# Patient Record
Sex: Male | Born: 1990
Health system: Southern US, Community
[De-identification: ages and names within clinical notes are randomized; demographics above are authoritative.]

## PROBLEM LIST (undated history)

## (undated) DIAGNOSIS — T7840XA Allergy, unspecified, initial encounter: Secondary | ICD-10-CM

## (undated) DIAGNOSIS — Z789 Other specified health status: Secondary | ICD-10-CM

## (undated) HISTORY — DX: Allergy, unspecified, initial encounter: T78.40XA

## (undated) HISTORY — PX: NO PAST SURGERIES: SHX2092

## (undated) HISTORY — DX: Other specified health status: Z78.9

---

## 2018-09-06 DIAGNOSIS — Z23 Encounter for immunization: Secondary | ICD-10-CM | POA: Diagnosis not present

## 2019-03-12 DIAGNOSIS — Z713 Dietary counseling and surveillance: Secondary | ICD-10-CM | POA: Diagnosis not present

## 2019-04-02 ENCOUNTER — Emergency Department (HOSPITAL_BASED_OUTPATIENT_CLINIC_OR_DEPARTMENT_OTHER): Payer: BLUE CROSS/BLUE SHIELD

## 2019-04-02 ENCOUNTER — Encounter (HOSPITAL_BASED_OUTPATIENT_CLINIC_OR_DEPARTMENT_OTHER): Payer: Self-pay | Admitting: *Deleted

## 2019-04-02 ENCOUNTER — Emergency Department (HOSPITAL_BASED_OUTPATIENT_CLINIC_OR_DEPARTMENT_OTHER)
Admission: EM | Admit: 2019-04-02 | Discharge: 2019-04-02 | Disposition: A | Discharge status: Home / Self Care | Payer: BLUE CROSS/BLUE SHIELD | Attending: Emergency Medicine | Admitting: Emergency Medicine

## 2019-04-02 ENCOUNTER — Other Ambulatory Visit: Payer: Self-pay

## 2019-04-02 DIAGNOSIS — N50819 Testicular pain, unspecified: Secondary | ICD-10-CM | POA: Diagnosis not present

## 2019-04-02 DIAGNOSIS — I861 Scrotal varices: Secondary | ICD-10-CM | POA: Diagnosis not present

## 2019-04-02 DIAGNOSIS — N50812 Left testicular pain: Secondary | ICD-10-CM | POA: Diagnosis not present

## 2019-04-02 DIAGNOSIS — N5082 Scrotal pain: Secondary | ICD-10-CM | POA: Diagnosis not present

## 2019-04-02 LAB — URINALYSIS, ROUTINE W REFLEX MICROSCOPIC
Bilirubin Urine: NEGATIVE
Glucose, UA: NEGATIVE mg/dL
Hgb urine dipstick: NEGATIVE
Ketones, ur: NEGATIVE mg/dL
Leukocytes,Ua: NEGATIVE
Nitrite: NEGATIVE
Protein, ur: NEGATIVE mg/dL
Specific Gravity, Urine: 1.02 (ref 1.005–1.030)
pH: 7 (ref 5.0–8.0)

## 2019-04-02 NOTE — ED Triage Notes (Signed)
Pain in his testicles since Monday. Pain has gone from his right to now in his left testicle.

## 2019-04-02 NOTE — ED Provider Notes (Signed)
[facility] EMERGENCY DEPARTMENT Provider Note   CSN: [id number] Arrival date & time: 04/02/19  1701  History   Chief Complaint Chief Complaint  Patient presents with  . Testicle Pain    HPI Noah Fitzgerald is a 28 y.o. male with no significant past medical history presenting with constant mild left sided testicular pain onset 1 day ago. Patient describes pain as a mild ache. Patient reports pain is worse with movement and better with rest. Patient states he has taken ibuprofen with partial relief. Patient reports he is not sexually active and states he has never been. Patient denies a history of STIs. Patient denies testicular edema. Patient denies penile pain, edema, or discharge. Patient denies fever, dysuria, hematuria, abdominal pain, nausea, vomiting, or diarrhea. Patient denies genital sores. Patient states he has not had these symptoms in the past.      HPI  History reviewed. No pertinent past medical history.  There are no active problems to display for this patient.   History reviewed. No pertinent surgical history.      Home Medications    Prior to Admission medications   Not on File    Family History No family history on file.  Social History Social History   Tobacco Use  . Smoking status: Never Smoker  . Smokeless tobacco: Never Used  Substance Use Topics  . Alcohol use: Yes    Frequency: Never  . Drug use: Never     Allergies   Patient has no known allergies.   Review of Systems Review of Systems  Constitutional: Negative for chills, diaphoresis and fever.  Respiratory: Negative for cough and shortness of breath.   Gastrointestinal: Negative for abdominal pain, diarrhea, nausea and vomiting.  Endocrine: Negative for cold intolerance and heat intolerance.  Genitourinary: Positive for testicular pain. Negative for discharge, dysuria, flank pain, frequency, genital sores, hematuria, penile pain, penile swelling and scrotal swelling.   Musculoskeletal: Negative for back pain.  Skin: Negative for rash and wound.  Allergic/Immunologic: Negative for immunocompromised state.  Hematological: Negative for adenopathy.    Physical Exam Updated Vital Signs BP (!) 148/84   Pulse 98   Temp 98.6 F (37 C) (Oral)   Resp 20   Ht  (1.88 m)   Wt 49.4 kg   SpO2 100%   BMI 13.99 kg/m   Physical Exam Vitals signs and nursing note reviewed. Exam conducted with a chaperone present.  Constitutional:      General: He is not in acute distress.    Appearance: He is well-developed. He is not diaphoretic.  HENT:     Head: Normocephalic and atraumatic.  Neck:     Musculoskeletal: Normal range of motion.  Cardiovascular:     Rate and Rhythm: Normal rate and regular rhythm.     Heart sounds: Normal heart sounds. No murmur. No friction rub. No gallop.   Pulmonary:     Effort: Pulmonary effort is normal. No respiratory distress.     Breath sounds: Normal breath sounds. No wheezing or rales.  Abdominal:     Palpations: Abdomen is soft.     Tenderness: There is no abdominal tenderness.     Hernia: There is no hernia in the right inguinal area or left inguinal area.  Genitourinary:    Pubic Area: No rash.      Penis: Circumcised. Lesions (Penile lesion noted. 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 No phimosis, paraphimosis, hypospadias, erythema, tenderness, discharge or swelling.  Scrotum/Testes: Cremasteric reflex is present.        Right: Mass, tenderness, swelling, testicular hydrocele or varicocele not present. Right testis is descended. Cremasteric reflex is present.         Left: Varicocele present. Mass, tenderness, swelling or testicular hydrocele not present. Left testis is descended. Cremasteric reflex is present.      Epididymis:     Right: Normal. Not inflamed or enlarged. No mass or tenderness.     Left: Normal. Not inflamed or enlarged. No mass or tenderness.  Musculoskeletal: Normal range  of motion.  Lymphadenopathy:     Lower Body: No right inguinal adenopathy. No left inguinal adenopathy.  Skin:    General: Skin is warm.     Findings: No erythema or rash.  Neurological:     Mental Status: He is alert.    ED Treatments / Results  Labs (all labs ordered are listed, but only abnormal results are displayed) Labs Reviewed  URINALYSIS, ROUTINE W REFLEX MICROSCOPIC - Abnormal; Notable for the following components:      Result Value   APPearance CLOUDY (*)    All other components within normal limits  GC/CHLAMYDIA PROBE AMP ([place]) NOT AT [facility]    EKG None  Radiology US Scrotum W/doppler  Result Date: 04/02/2019 CLINICAL DATA:  Left testicular pain and soreness for 2 days. EXAM: SCROTAL ULTRASOUND DOPPLER ULTRASOUND OF THE TESTICLES TECHNIQUE: Complete ultrasound examination of the testicles, epididymis, and other scrotal structures was performed. Color and spectral Doppler ultrasound were also utilized to evaluate blood flow to the testicles. COMPARISON:  None. FINDINGS: Right testicle Measurements: 4.0 x 2.0 x 3.1 cm. No mass or microlithiasis visualized. Left testicle Measurements: 4.1 x 2.3 x 2.6 cm. No mass or microlithiasis visualized. Right epididymis: 1.2 x 0.7 x 0.8 cm cyst in the head of the epididymis noted. Otherwise negative. Left epididymis:  Normal in size and appearance. Hydrocele:  None visualized. Varicocele: The patient has a varicocele on the left with multiple dilated veins measuring up to 0.4 cm identified. Negative for right varicocele. Pulsed Doppler interrogation of both testes demonstrates normal low resistance arterial and venous waveforms bilaterally. IMPRESSION: Left varicocele. Small cyst in the head of the right epididymis. Normal appearing testicles.  Negative for torsion. Electronically Signed   By: Drusilla Kanner M.D.   On: 04/02/2019 18:56    Procedures Procedures (including critical care time)  Medications Ordered in ED  Medications - No data to display  Initial Impression / Assessment and Plan / ED Course  I have reviewed the triage vital signs and the nursing notes.  Pertinent labs & imaging results that were available during my care of the patient were reviewed by me and considered in my medical decision making (see chart for details).  Clinical Course as of Apr 01 1913  Thu Apr 02, 2019  1901 Ultrasound reveals left varicocele. Small cyst in the head of the right epididymis. Normal appearing testicles. Negative for torsion.    US SCROTUM W/DOPPLER [AH]    Clinical Course User Index [AH] Leretha Dykes, PA-C      Patient presents with testicular pain. UA is negative. Patient is afebrile without abdominal tenderness, abdominal pain or painful bowel movements to indicate prostatitis.  No tenderness to palpation of the testes or epididymis to suggest orchitis or epididymitis.  STD cultures obtained including gonorrhea and chlamydia.  Discussed importance of using protection when sexually active. Pt understands that they have GC/Chlamydia cultures pending and that they  will need to inform all sexual partners if results return positive. Patient states he is not sexually active and has never been. Ultrasound is negative for torsion. Ultrasound reveals a left varicocele and a small cyst in the head of the right epididymis. Advised patient to take NSAIDs as needed for pain. Advised patient to follow up with urology if symptoms persist. Patient to be discharged with instructions to follow up with PCP.  Findings and plan of care discussed with supervising physician Dr. Pilar PlateBero.  Final Clinical Impressions(s) / ED Diagnoses   Final diagnoses:  Testicular pain    ED Discharge Orders    None       Leretha DykesHernandez, Coni Homesley P, [place] JerseyPA-C 04/02/19 1918    Sabas SousBero, Michael M, MD 04/06/19 1204

## 2019-04-02 NOTE — Discharge Instructions (Signed)
You have been seen today for testicular pain. Please read and follow all provided instructions.   1. Medications: ibuprofen for pain, usual home medications 2. Treatment: rest, drink plenty of fluids 3. Follow Up: Please follow up with urology is symptoms persist. Please follow up with your primary doctor in 7 days for discussion of your diagnoses and further evaluation after today's visit; if you do not have a primary care doctor use the resource guide provided to find one; Please return to the ER for any new or worsening symptoms. Please obtain all of your results from medical records or have your doctors office obtain the results - share them with your doctor - you should be seen at your doctors office. Call today to arrange your follow up.   Take medications as prescribed. Please review all of the medicines and only take them if you do not have an allergy to them. Return to the emergency room for worsening condition or new concerning symptoms. Follow up with your regular doctor. If you don't have a regular doctor use one of the numbers below to establish a primary care doctor.  Please be aware that if you are taking birth control pills, taking other prescriptions, ESPECIALLY ANTIBIOTICS may make the birth control ineffective - if this is the case, either do not engage in sexual activity or use alternative methods of birth control such as condoms until you have finished the medicine and your family doctor says it is OK to restart them. If you are on a blood thinner such as COUMADIN, be aware that any other medicine that you take may cause the coumadin to either work too much, or not enough - you should have your coumadin level rechecked in next 7 days if this is the case.  ?  It is also a possibility that you have an allergic reaction to any of the medicines that you have been prescribed - Everybody reacts differently to medications and while MOST people have no trouble with most medicines, you may have  a reaction such as nausea, vomiting, rash, swelling, shortness of breath. If this is the case, please stop taking the medicine immediately and contact your physician.  ?  You should return to the ER if you develop severe or worsening symptoms.   Emergency Department Resource Guide 1) Find a Doctor and Pay Out of Pocket Although you won't have to find out who is covered by your insurance plan, it is a good idea to ask around and get recommendations. You will then need to call the office and see if the doctor you have chosen will accept you as a new patient and what types of options they offer for patients who are self-pay. Some doctors offer discounts or will set up payment plans for their patients who do not have insurance, but you will need to ask so you aren't surprised when you get to your appointment.  2) Contact Your Local Health Department Not all health departments have doctors that can see patients for sick visits, but many do, so it is worth a call to see if yours does. If you don't know where your local health department is, you can check in your phone book. The CDC also has a tool to help you locate your state's health department, and many state websites also have listings of all of their local health departments.  3) Find a Walk-in Clinic If your illness is not likely to be very severe or complicated, you may want to  try a walk in clinic. These are popping up all over the country in pharmacies, drugstores, and shopping centers. They're usually staffed by nurse practitioners or physician assistants that have been trained to treat common illnesses and complaints. They're usually fairly quick and inexpensive. However, if you have serious medical issues or chronic medical problems, these are probably not your best option.  No Primary Care Doctor: Call Health Connect at  [phone] - they can help you locate a primary care doctor that  accepts your insurance, provides certain services,  etc. Physician Referral Service- [phone]  Chronic Pain Problems: Organization         Address  Phone   Notes  [facility]  [phone] Patients need to be referred by their primary care doctor.   Medication Assistance: Organization         Address  Phone   Notes  [facility] Medication [facility] [address] [place], [place] [address] [phone] --Must be a resident of [facility] -- Must have NO insurance coverage whatsoever (no Medicaid/ Medicare, etc.) -- The pt. MUST have a primary care doctor that directs their care regularly and follows them in the community   [facility]  [phone]   [employer]  [phone]    Agencies that provide inexpensive medical care: Organization         Address  Phone   Notes  [facility]  [phone]   [facility]    [phone]   [facility] [place], [place] [address] [phone]   [place] [address] [place] [phone]   [facility]    [phone]   [facility]    [phone]   [place] [address] [place], [place] Phone:  [phone], Fax:  [phone] Hours of Operation:  9 am - 6 pm, M-F.  Also accepts Medicaid/Medicare and self-pay.  [facility] for [place], [address], [place] Phone: [phone], Fax: [phone]. Hours of Operation:  8:30 am - 5:30 pm, M-F.  Also accepts Medicaid and self-pay.  [facility] [address], [place] Phone: [phone]   [place], [place], [place] [phone], Ext. [phone] Mondays & Thursdays: 7-9 AM.  First 15 patients are seen on a first come, first serve basis.    [place] Providers:  Organization         Address  Phone   Notes  [facility] [address], [place] [phone] Also accepts self-pay patients.  [facility] [address], [place]  [phone]   [address], [address], [place] [phone]   [facility] [address] [place] [phone]   Lucianne Lei [address], [place]   [phone] Only accepts [place] [facility] [place] patients after they have their name applied to their card.   Self-Pay (no insurance) in [facility]:  Organization         Address  Phone   Notes  Sickle Cell Patients, [facility] [place] [phone]   [facility] [place] [phone]   Gershon Mussel  [facility]  [address], [address], [place] [phone]   [facility]/Dr. Osei-Bonsu  [address], [place] or [address], [place] [phone] Phone number for both [place] and [place] locations is the same.  [facility] [address] [place] [phone]   [facility] [address] [place] or [address] [phone]   [facility] [address] [place] [phone], phone; [phone], fax Sees patients 1st and 3rd Saturday of every month.  Must not qualify for public or private insurance (i.e. Medicaid, Medicare, [place] Health Choice, Veterans' Benefits)  Household income should be no more than 200% of the poverty level The clinic cannot treat you if you are pregnant or think you are pregnant  Sexually transmitted diseases are not treated at the clinic.

## 2019-04-03 LAB — GC/CHLAMYDIA PROBE AMP (~~LOC~~) NOT AT ARMC
Chlamydia: NEGATIVE
Neisseria Gonorrhea: NEGATIVE

## 2019-04-08 DIAGNOSIS — Z713 Dietary counseling and surveillance: Secondary | ICD-10-CM | POA: Diagnosis not present

## 2019-04-15 DIAGNOSIS — I861 Scrotal varices: Secondary | ICD-10-CM | POA: Diagnosis not present

## 2019-04-15 DIAGNOSIS — N4341 Spermatocele of epididymis, single: Secondary | ICD-10-CM | POA: Diagnosis not present

## 2019-08-08 DIAGNOSIS — Z23 Encounter for immunization: Secondary | ICD-10-CM | POA: Diagnosis not present

## 2019-08-08 DIAGNOSIS — J019 Acute sinusitis, unspecified: Secondary | ICD-10-CM | POA: Diagnosis not present

## 2019-08-08 DIAGNOSIS — H6693 Otitis media, unspecified, bilateral: Secondary | ICD-10-CM | POA: Diagnosis not present

## 2019-08-21 ENCOUNTER — Other Ambulatory Visit: Payer: Self-pay

## 2019-08-24 ENCOUNTER — Encounter: Payer: Self-pay | Admitting: Family Medicine

## 2019-08-24 ENCOUNTER — Ambulatory Visit: Payer: BC Managed Care – PPO | Admitting: Family Medicine

## 2019-08-24 ENCOUNTER — Other Ambulatory Visit: Payer: Self-pay

## 2019-08-24 VITALS — BP 110/68 | HR 83 | Temp 97.3°F | Ht 74.0 in | Wt 182.0 lb

## 2019-08-24 DIAGNOSIS — Z0000 Encounter for general adult medical examination without abnormal findings: Secondary | ICD-10-CM | POA: Diagnosis not present

## 2019-08-24 DIAGNOSIS — H9213 Otorrhea, bilateral: Secondary | ICD-10-CM | POA: Diagnosis not present

## 2019-08-24 LAB — CBC
HCT: 44.4 % (ref 39.0–52.0)
Hemoglobin: 15 g/dL (ref 13.0–17.0)
MCHC: 33.9 g/dL (ref 30.0–36.0)
MCV: 88.9 fl (ref 78.0–100.0)
Platelets: 233 10*3/uL (ref 150.0–400.0)
RBC: 5 Mil/uL (ref 4.22–5.81)
RDW: 12.4 % (ref 11.5–15.5)
WBC: 4.7 10*3/uL (ref 4.0–10.5)

## 2019-08-24 LAB — LIPID PANEL
Cholesterol: 178 mg/dL (ref 0–200)
HDL: 48.6 mg/dL (ref >39.00)
LDL Cholesterol: 113 mg/dL — ABNORMAL HIGH (ref 0–99)
NonHDL: 129.51
Total CHOL/HDL Ratio: 4
Triglycerides: 85 mg/dL (ref 0.0–149.0)
VLDL: 17 mg/dL (ref 0.0–40.0)

## 2019-08-24 LAB — COMPREHENSIVE METABOLIC PANEL
ALT: 12 U/L (ref 0–53)
AST: 14 U/L (ref 0–37)
Albumin: 4.9 g/dL (ref 3.5–5.2)
Alkaline Phosphatase: 44 U/L (ref 39–117)
BUN: 12 mg/dL (ref 6–23)
CO2: 31 mEq/L (ref 19–32)
Calcium: 9.4 mg/dL (ref 8.4–10.5)
Chloride: 102 mEq/L (ref 96–112)
Creatinine, Ser: 0.8 mg/dL (ref 0.40–1.50)
GFR: 116.11 mL/min (ref >60.00)
Glucose, Bld: 89 mg/dL (ref 70–99)
Potassium: 4.3 mEq/L (ref 3.5–5.1)
Sodium: 139 mEq/L (ref 135–145)
Total Bilirubin: 0.9 mg/dL (ref 0.2–1.2)
Total Protein: 6.8 g/dL (ref 6.0–8.3)

## 2019-08-24 MED ORDER — CIPROFLOXACIN-DEXAMETHASONE 0.3-0.1 % OT SUSP: 4.0000 [drp] | Freq: Two times a day (BID) | OTIC | 0 refills | Status: DC

## 2019-08-24 NOTE — Progress Notes (Signed)
Chief Complaint  Patient presents with  . New Patient (Initial Visit)    ear problem    Well Male Noah Fitzgerald is here for a complete physical.   His last physical was >1 year ago.  Current diet: in general, diet could be better.   Current exercise: cardio, wt training Weight trend: stable Daytime fatigue? No. Seat belt? Yes.     Health maintenance Tetanus- Yes HIV- No   Over the past 6 weeks, the patient has been experiencing a fluid sensation in both of his ears.  The right is worse than the left.  He will very rarely have a clear fluid come out in small quantities.  There is no injury to his ear and he is not having any pain.  He does not use Q-tips.  He was placed on Augmentin and Flonase.  The Augmentin helped a little bit.  He is no longer taking the Flonase.  His hearing is intact.  There is no bleeding from his ears.  He does not have a history of allergies or any nasal congestion.  Past Medical History:  Diagnosis Date  . No known health problems      Past Surgical History:  Procedure Laterality Date  . NO PAST SURGERIES      Medications  Takes no meds routinely.   Allergies No Known Allergies  Family History Family History  Problem Relation Age of Onset  . Hyperlipidemia Father   . Hypertension Father   . Breast cancer Maternal Grandmother   . Colon cancer Maternal Grandfather 60    Review of Systems: Constitutional: no fevers or chills Eye:  no recent significant change in vision Ear/Nose/Mouth/Throat:  Ears: As noted in HPI Nose/Mouth/Throat:  no complaints of nasal congestion, no sore throat Cardiovascular:  no chest pain, no palpitations Respiratory:  no cough and no shortness of breath Gastrointestinal:  no abdominal pain, no change in bowel habits GU:  Male: negative for dysuria, frequency, and incontinence and negative for prostate symptoms Musculoskeletal/Extremities:  no pain, redness, or swelling of the joints Integumentary (Skin/Breast):   no abnormal skin lesions reported Neurologic:  no headaches, no numbness, tingling Endocrine: No unexpected weight changes Hematologic/Lymphatic:  no night sweats  Exam BP 110/68 (BP Location: Left Arm, Patient Position: Sitting, Cuff Size: Normal)   Pulse 83   Temp (!) 97.3 F (36.3 C) (Temporal)   Ht 6\' 2"  (1.88 m)   Wt 182 lb (82.6 kg)   SpO2 96%   BMI 23.37 kg/m  General:  well developed, well nourished, in no apparent distress Skin:  no significant moles, warts, or growths Head:  no masses, lesions, or tenderness Eyes:  pupils equal and round, sclera anicteric without injection Ears:  canals without lesions, TMs shiny without retraction, no obvious effusion, no erythema, some scarring is noted on the right TM Nose:  nares patent, septum midline, mucosa normal Throat/Pharynx:  lips and gingiva without lesion; tongue and uvula midline; non-inflamed pharynx; no exudates or postnasal drainage Neck: neck supple without adenopathy, thyromegaly, or masses Lungs:  clear to auscultation, breath sounds equal bilaterally, no respiratory distress Cardio:  regular rate and rhythm, no bruits, no LE edema Abdomen:  abdomen soft, nontender; bowel sounds normal; no masses or organomegaly Genital (male): circumcised penis, there is a raised flesh-colored lesion on the left dorsal shaft with surrounding hypopigmentation; no discharge; testes present bilaterally without masses or tenderness Rectal: Deferred Musculoskeletal:  symmetrical muscle groups noted without atrophy or deformity Extremities:  no  clubbing, cyanosis, or edema, no deformities, no skin discoloration Neuro:  gait normal; deep tendon reflexes normal and symmetric Psych: well oriented with normal range of affect and appropriate judgment/insight  Assessment and Plan  Well adult exam - Plan: CBC, Comprehensive metabolic panel, Lipid panel, HIV Antibody (routine testing w rflx)  Otorrhea of both ears - Plan:  ciprofloxacin-dexamethasone (CIPRODEX) OTIC suspension   Well 28 y.o. male. Counseled on diet and exercise. Other orders as above. Go back on Flonase, trial drops for 10 days.  If no improvement, will refer to the ENT team. Follow up in 1 year pending the above workup. The patient voiced understanding and agreement to the plan.  [place], DO 08/24/19 10:49 AM

## 2019-08-24 NOTE — Patient Instructions (Addendum)
Give [place] 2-3 business days to get the results of your labs back.   Keep the diet clean and stay active.  Do monthly self testicular checks in the shower. You are feeling for lumps/bumps that don't belong. If you feel anything like this, let me know!   Go back on the Flonase.    Put a cotton ball in your ear after placing the drops for 10-15 min.   If no improvement over the next 3-4 weeks, send me a message and we will get you in with the ENT team.   Let us know if you need anything.

## 2019-08-25 LAB — HIV ANTIBODY (ROUTINE TESTING W REFLEX): HIV 1&2 Ab, 4th Generation: NONREACTIVE

## 2019-11-08 ENCOUNTER — Encounter: Payer: Self-pay | Admitting: Family Medicine

## 2020-06-20 IMAGING — US ULTRASOUND SCROTUM DOPPLER COMPLETE
1 series · 14 of 25 positions shown · non-contrast
Comparison: None.

CLINICAL DATA: Left testicular pain and soreness for 2 days.

EXAM:
SCROTAL ULTRASOUND
DOPPLER ULTRASOUND OF THE TESTICLES
TECHNIQUE: Complete ultrasound examination of the testicles, epididymis, and
other scrotal structures was performed. Color and spectral Doppler
ultrasound were also utilized to evaluate blood flow to the
testicles.

[Series 1: ultrasound scrotum doppler complete · 14 of 56 slices shown]
[im 1/56]
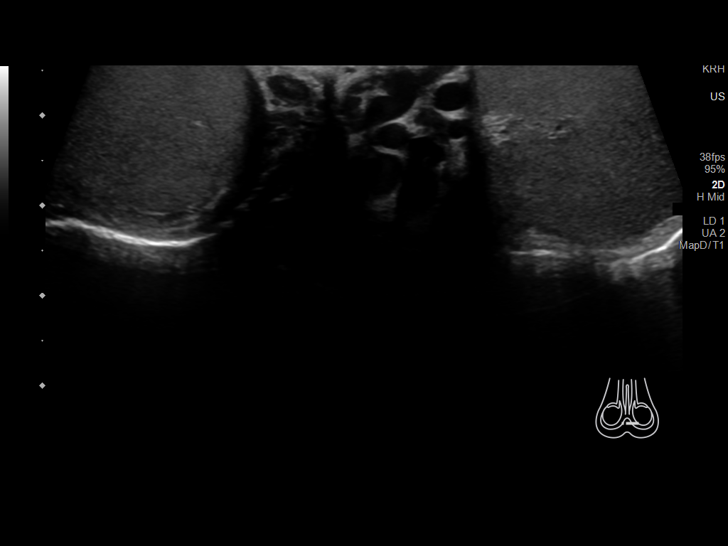
[im 5/56]
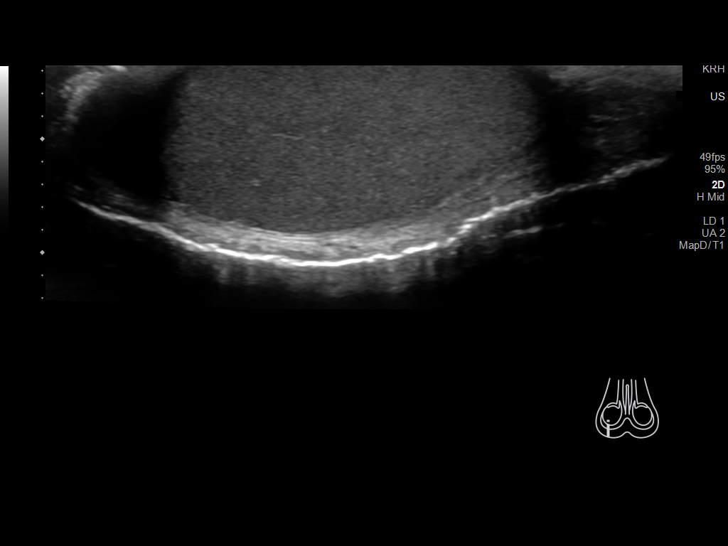
[im 10/56]
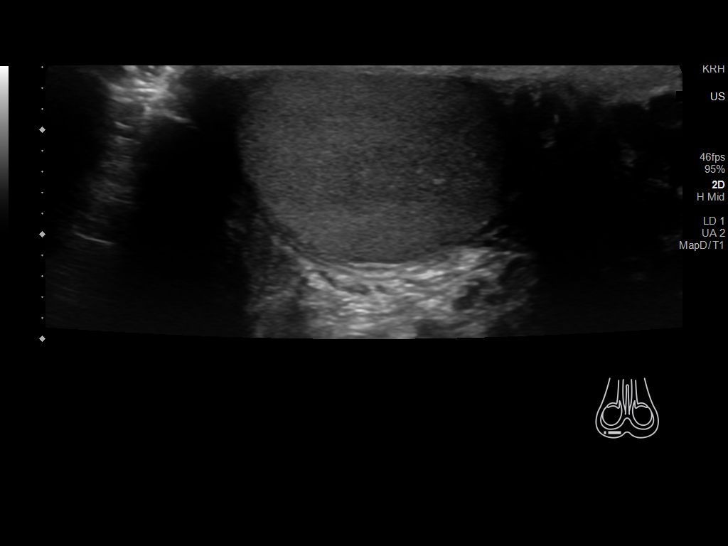
[im 14/56]
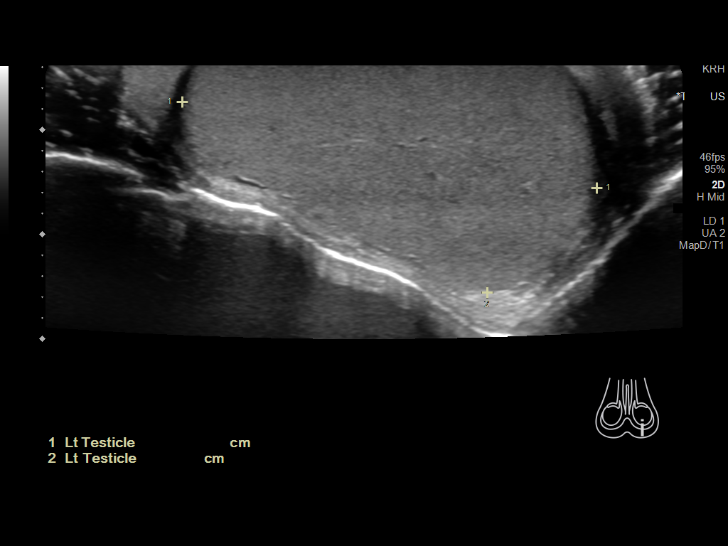
[im 19/56]
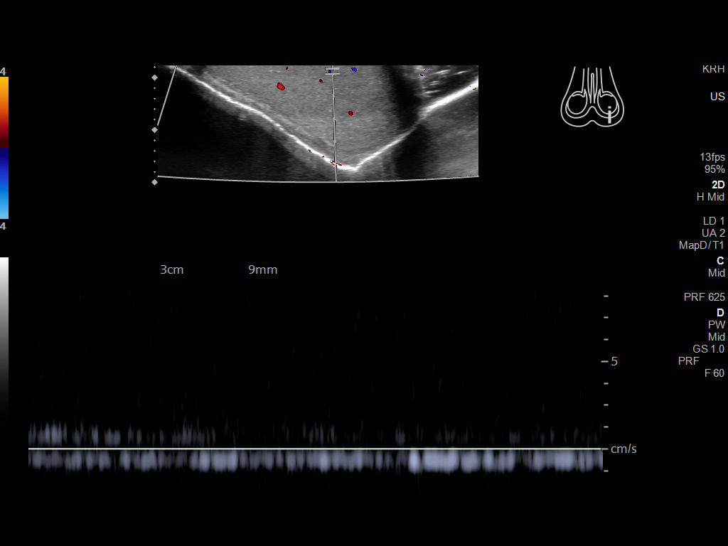
[im 21/56]
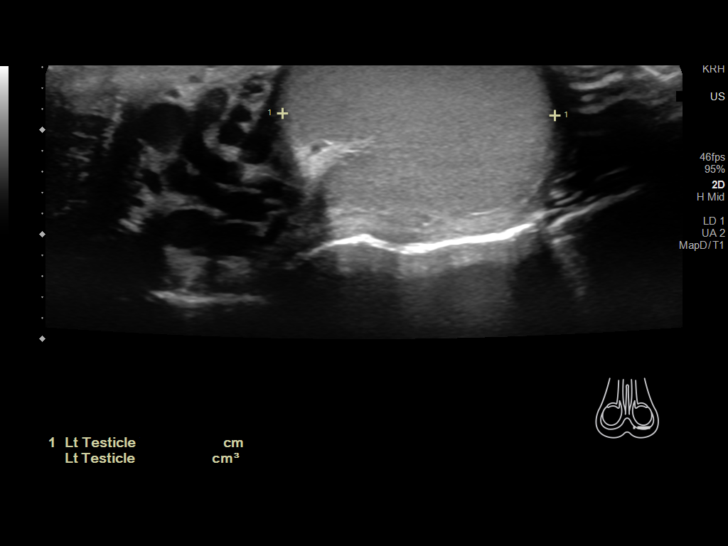
[im 26/56]
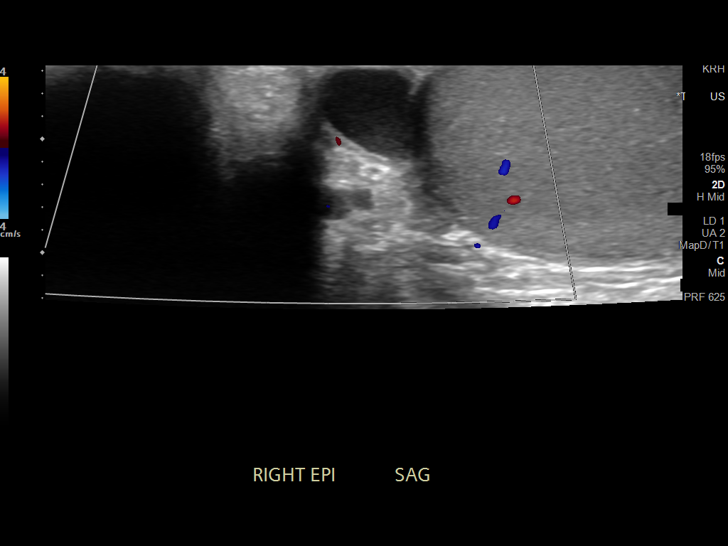
[im 30/56]
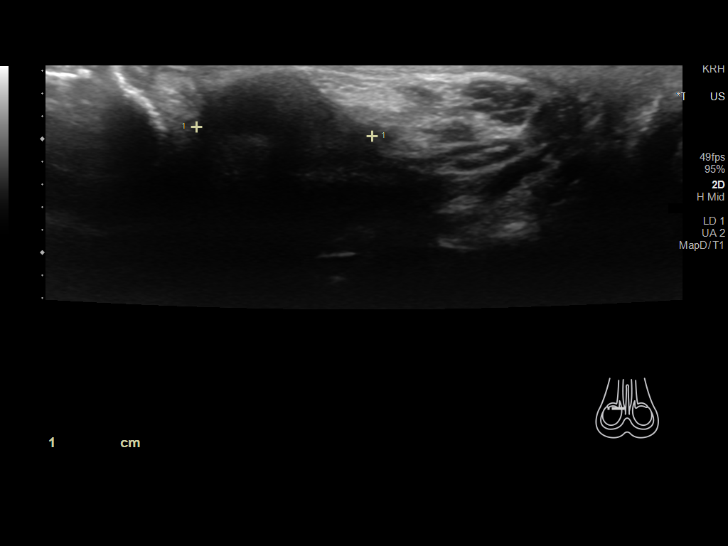
[im 35/56]
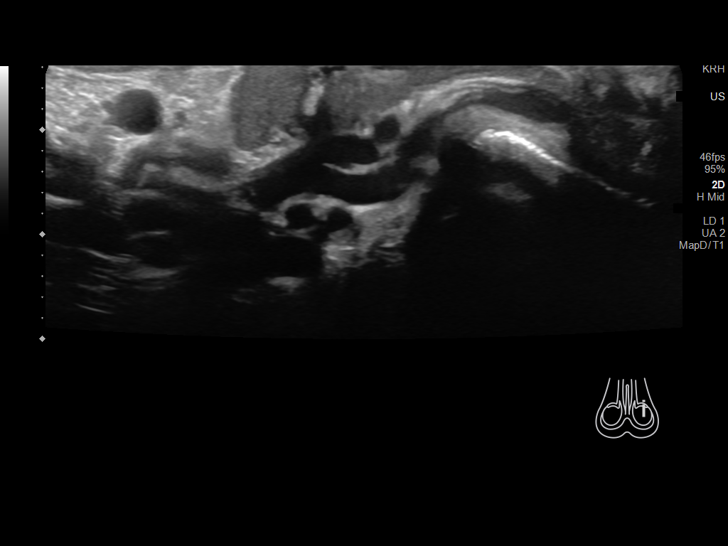
[im 37/56]
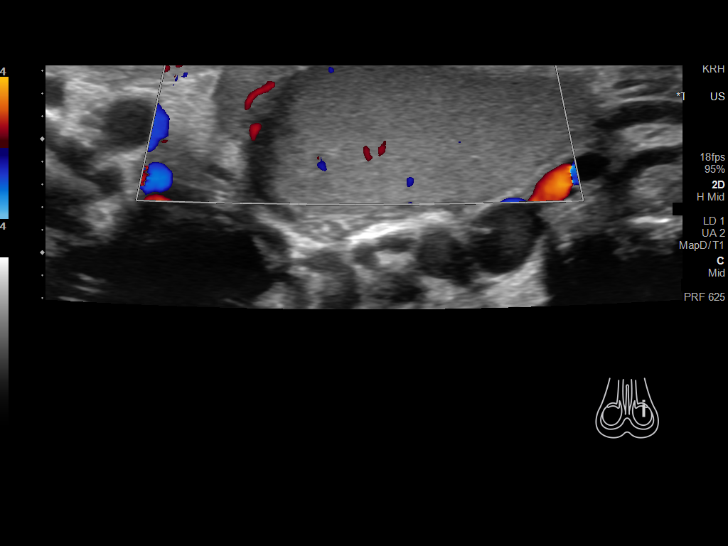
[im 42/56]
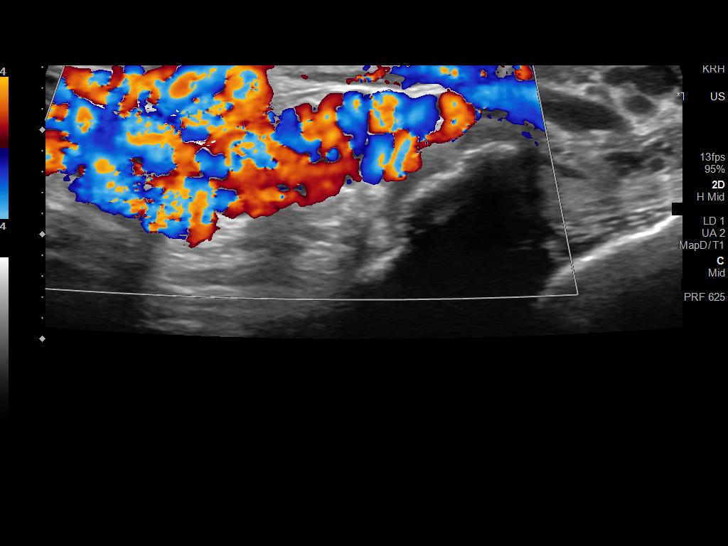
[im 46/56]
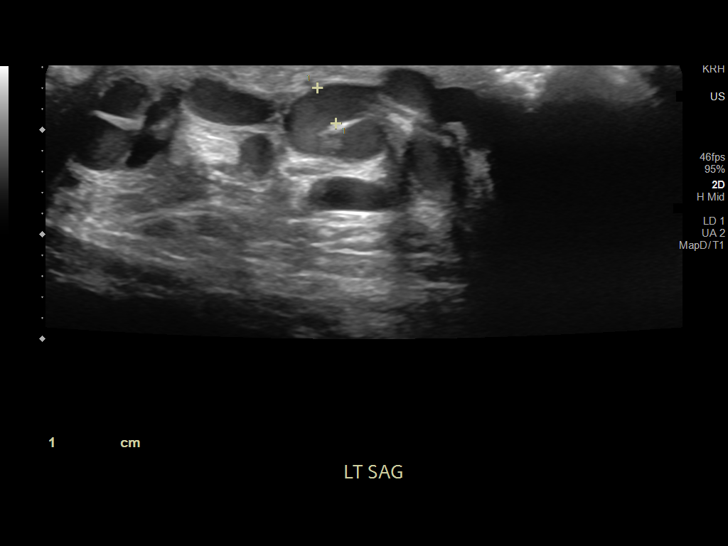
[im 51/56]
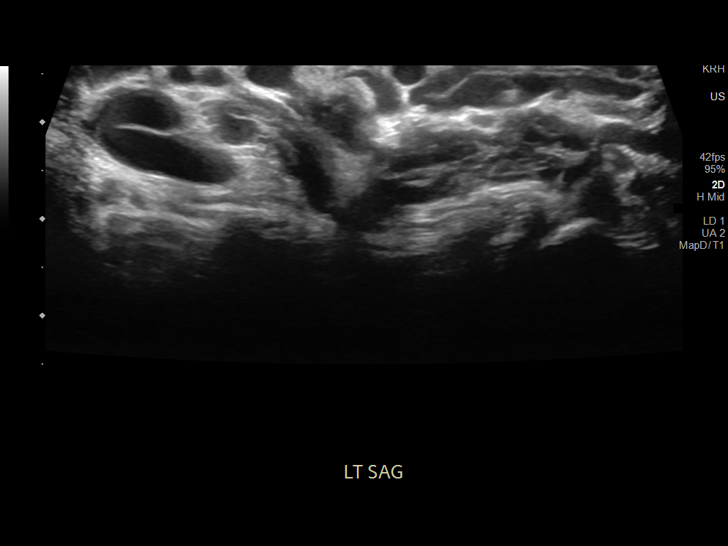
[im 56/56]
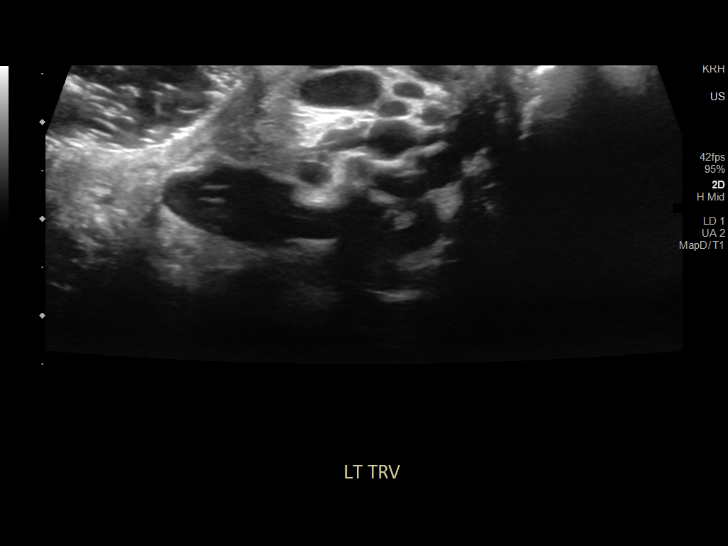

[14 of 25 positions shown; findings below may reference images not displayed]

FINDINGS: Right testicle

Measurements: 4.0 x 2.0 x 3.1 cm. No mass or microlithiasis
visualized.

Left testicle

Measurements: 4.1 x 2.3 x 2.6 cm. No mass or microlithiasis
visualized.

Right epididymis: 1.2 x 0.7 x 0.8 cm cyst in the head of the
epididymis noted. Otherwise negative.

Left epididymis:  Normal in size and appearance.

Hydrocele:  None visualized.

Varicocele: The patient has a varicocele on the left with multiple
dilated veins measuring up to 0.4 cm identified. Negative for right
varicocele.

Pulsed Doppler interrogation of both testes demonstrates normal low
resistance arterial and venous waveforms bilaterally.
IMPRESSION: Left varicocele.

Small cyst in the head of the right epididymis.

Normal appearing testicles.  Negative for torsion.

## 2020-08-24 ENCOUNTER — Ambulatory Visit (INDEPENDENT_AMBULATORY_CARE_PROVIDER_SITE_OTHER): Payer: BC Managed Care – PPO | Admitting: Family Medicine

## 2020-08-24 ENCOUNTER — Encounter: Payer: Self-pay | Admitting: Family Medicine

## 2020-08-24 ENCOUNTER — Other Ambulatory Visit: Payer: Self-pay

## 2020-08-24 VITALS — BP 102/62 | HR 74 | Temp 98.3°F | Ht 74.0 in | Wt 192.0 lb

## 2020-08-24 DIAGNOSIS — Z0000 Encounter for general adult medical examination without abnormal findings: Secondary | ICD-10-CM | POA: Diagnosis not present

## 2020-08-24 DIAGNOSIS — Z23 Encounter for immunization: Secondary | ICD-10-CM | POA: Diagnosis not present

## 2020-08-24 DIAGNOSIS — E781 Pure hyperglyceridemia: Secondary | ICD-10-CM | POA: Diagnosis not present

## 2020-08-24 DIAGNOSIS — Z1159 Encounter for screening for other viral diseases: Secondary | ICD-10-CM

## 2020-08-24 NOTE — Patient Instructions (Addendum)
Give [place] 2-3 business days to get the results of your labs back.   Keep the diet clean and stay active.  Do monthly self testicular checks in the shower. You are feeling for lumps/bumps that don't belong. If you feel anything like this, let me know!  Send [place] a picture of your COVID vaccine card when convenient.   Send me a message if you want to see an ear-nose-throat specialist.   Let us know if you need anything.   Pectoralis Major Rehab Ask your health care provider which exercises are safe for you. Do exercises exactly as told by your health care provider and adjust them as directed. It is normal to feel mild stretching, pulling, tightness, or discomfort as you do these exercises, but you should stop right away if you feel sudden pain or your pain gets worse.Do not begin these exercises until told by your health care provider. Stretching and range of motion exercises These exercises warm up your muscles and joints and improve the movement and flexibility of your shoulder. These exercises can also help to relieve pain, numbness, and tingling. Exercise A: Pendulum  1. Stand near a wall or a surface that you can hold onto for balance. 2. Bend at the waist and let your left / right arm hang straight down. Use your other arm to keep your balance. 3. Relax your arm and shoulder muscles, and move your hips and your trunk so your left / right arm swings freely. Your arm should swing because of the motion of your body, not because you are using your arm or shoulder muscles. 4. Keep moving so your arm swings in the following directions, as told by your health care provider: ? Side to side. ? Forward and backward. ? In clockwise and counterclockwise circles. 5. Slowly return to the starting position. Repeat 2 times. Complete this exercise 3 times per week. Exercise B: Abduction, standing 1. Stand and hold a broomstick, a cane, or a similar object. Place your hands a little more than  shoulder-width apart on the object. Your left / right hand should be palm-up, and your other hand should be palm-down. 2. While keeping your elbow straight and your shoulder muscles relaxed, push the stick across your body toward your left / right side. Raise your left / right arm to the side of your body and then over your head until you feel a stretch in your shoulder. ? Stop when you reach the angle that is recommended by your health care provider. ? Avoid shrugging your shoulder while you raise your arm. Keep your shoulder blade tucked down toward the middle of your spine. 3. Hold for 10 seconds. 4. Slowly return to the starting position. Repeat 2 times. Complete this exercise 3 times per week. Exercise C: Wand flexion, supine  1. Lie on your back. You may bend your knees for comfort. 2. Hold a broomstick, a cane, or a similar object so that your hands are about shoulder-width apart on the object. Your palms should face toward your feet. 3. Raise your left / right arm in front of your face, then behind your head (toward the floor). Use your other hand to help you do this. Stop when you feel a gentle stretch in your shoulder, or when you reach the angle that is recommended by your health care provider. 4. Hold for 3 seconds. 5. Use the broomstick and your other arm to help you return your left / right arm to the starting position.  Repeat 2 times. Complete this exercise 3 times per week. Exercise D: Wand shoulder external rotation 1. Stand and hold a broomstick, a cane, or a similar object so your handsare about shoulder-width apart on the object. 2. Start with your arms hanging down, then bend both elbows to an "L" shape (90 degrees). 3. Keep your left / right elbow at your side. Use your other hand to push the stick so your left / right forearm moves away from your body, out to your side. ? Keep your left / right elbow bent to 90 degrees and keep it against your side. ? Stop when you feel a  gentle stretch in your shoulder, or when you reach the angle recommended by your health care provider. 4. Hold for 10 seconds. 5. Use the stick to help you return your left / right arm to the starting position. Repeat 2 times. Complete this exercise 3 times per week. Strengthening exercises These exercises build strength and endurance in your shoulder. Endurance is the ability to use your muscles for a long time, even after your muscles get tired. Exercise E: Scapular protraction, standing 1. Stand so you are facing a wall. Place your feet about one arm-length away from the wall. 2. Place your hands on the wall and straighten your elbows. 3. Keep your hands on the wall as you push your upper back away from the wall. You should feel your shoulder blades sliding forward.Keep your elbows and your head still. ? If you are not sure that you are doing this exercise correctly, ask your health care provider for more instructions. 4. Hold for 3 seconds. 5. Slowly return to the starting position. Let your muscles relax completely before you repeat this exercise. Repeat 2 times. Complete this exercise 3 times per week. Exercise F: Shoulder blade squeezes  (scapular retraction) 1. Sit with good posture in a stable chair. Do not let your back touch the back of the chair. 2. Your arms should be at your sides with your elbows bent. You may rest your forearms on a pillow if that is more comfortable. 3. Squeeze your shoulder blades together. Bring them down and back. ? Keep your shoulders level. ? Do not lift your shoulders up toward your ears. 4. Hold for 3 seconds. 5. Return to the starting position. Repeat 2 times. Complete this exercise 3 times per week. This information is not intended to replace advice given to you by your health care provider. Make sure you discuss any questions you have with your health care provider. Document Released: 11/05/2005 Document Revised: 08/16/2016 Document Reviewed:  07/24/2015 Elsevier Interactive Patient Education  [employer].

## 2020-08-24 NOTE — Progress Notes (Signed)
Chief Complaint  Patient presents with  . Annual Exam    Well Male Noah Fitzgerald is here for a complete physical.   His last physical was >1 year ago.  Current diet: in general, an "OK" diet.   Current exercise: nothing consistent Weight trend: stable Fatigue out of ordinary? No. Seat belt? Yes.    Health maintenance Tetanus- Yes HIV- Yes Hep C- No  Past Medical History:  Diagnosis Date  . No known health problems      Past Surgical History:  Procedure Laterality Date  . NO PAST SURGERIES     Medications  Takes no meds routinely.  Allergies No Known Allergies  Family History Family History  Problem Relation Age of Onset  . Hyperlipidemia Father   . Hypertension Father   . Breast cancer Maternal Grandmother   . Colon cancer Maternal Grandfather 60    Review of Systems: Constitutional: no fevers or chills Eye:  no recent significant change in vision Ear/Nose/Mouth/Throat:  Ears:  no hearing loss Nose/Mouth/Throat:  no complaints of nasal congestion, no sore throat Cardiovascular:  no chest pain Respiratory:  no shortness of breath Gastrointestinal:  no abdominal pain, no change in bowel habits GU:  Male: negative for dysuria Musculoskeletal/Extremities: +intermittent pain of L side of chest; otherwise no pain of the joints Integumentary (Skin/Breast):  no abnormal skin lesions reported Neurologic:  no headaches Endocrine: No unexpected weight changes Hematologic/Lymphatic:  no night sweats  Exam BP 102/62 (BP Location: Left Arm, Patient Position: Sitting, Cuff Size: Normal)   Pulse 74   Temp 98.3 F (36.8 C) (Oral)   Ht 6\' 2"  (1.88 m)   Wt 192 lb (87.1 kg)   SpO2 96%   BMI 24.65 kg/m  General:  well developed, well nourished, in no apparent distress Skin:  no significant moles, warts, or growths Head:  no masses, lesions, or tenderness Eyes:  pupils equal and round, sclera anicteric without injection Ears:  canals without lesions, TMs shiny  without retraction, no obvious effusion, no erythema Nose:  nares patent, septum midline, mucosa normal Throat/Pharynx:  lips and gingiva without lesion; tongue and uvula midline; non-inflamed pharynx; no exudates or postnasal drainage Neck: neck supple without adenopathy, thyromegaly, or masses Lungs:  clear to auscultation, breath sounds equal bilaterally, no respiratory distress Cardio:  regular rate and rhythm, no bruits, no LE edema Abdomen:  abdomen soft, nontender; bowel sounds normal; no masses or organomegaly Rectal: Deferred Musculoskeletal: No ttp over chest wall;  symmetrical muscle groups noted without atrophy or deformity Extremities:  no clubbing, cyanosis, or edema, no deformities, no skin discoloration Neuro:  gait normal; deep tendon reflexes normal and symmetric Psych: well oriented with normal range of affect and appropriate judgment/insight  Assessment and Plan  Well adult exam - Plan: Lipid panel, Comprehensive metabolic panel  Need for influenza vaccination - Plan: Flu Vaccine QUAD 6+ mos PF IM (Fluarix Quad PF)  Encounter for hepatitis C screening test for low risk patient - Plan: Hepatitis C antibody   Well 29 y.o. male. Counseled on diet and exercise. Self testicular exams recommended at least monthly.  Briefly mentioned ear. No issues currently but it seems to be recurrent. ENT referral if it comes back again, though it may be related to ETD so he will use Flonase more routinely during certain times of the year. He will send 34 a message w copy of vaccine card. Other orders as above. Follow up in 1 year pending the above workup. The patient  voiced understanding and agreement to the plan.  Jilda Roche [place], DO 08/24/20 8:00 AM

## 2020-08-24 NOTE — Addendum Note (Signed)
Addended by: Mervin Kung A on: 08/24/2020 08:02 AM   Modules accepted: Orders

## 2020-08-25 LAB — COMPREHENSIVE METABOLIC PANEL
AG Ratio: 2.4 (calc) (ref 1.0–2.5)
ALT: 9 U/L (ref 9–46)
AST: 12 U/L (ref 10–40)
Albumin: 4.8 g/dL (ref 3.6–5.1)
Alkaline phosphatase (APISO): 43 U/L (ref 36–130)
BUN: 12 mg/dL (ref 7–25)
CO2: 28 mmol/L (ref 20–32)
Calcium: 9.4 mg/dL (ref 8.6–10.3)
Chloride: 103 mmol/L (ref 98–110)
Creat: 0.96 mg/dL (ref 0.60–1.35)
Globulin: 2 g/dL (calc) (ref 1.9–3.7)
Glucose, Bld: 86 mg/dL (ref 65–99)
Potassium: 4.6 mmol/L (ref 3.5–5.3)
Sodium: 139 mmol/L (ref 135–146)
Total Bilirubin: 0.6 mg/dL (ref 0.2–1.2)
Total Protein: 6.8 g/dL (ref 6.1–8.1)

## 2020-08-25 LAB — LIPID PANEL
Cholesterol: 195 mg/dL (ref <200)
HDL: 50 mg/dL (ref >=40)
LDL Cholesterol (Calc): 127 mg/dL (calc) — ABNORMAL HIGH
Non-HDL Cholesterol (Calc): 145 mg/dL (calc) — ABNORMAL HIGH (ref <130)
Total CHOL/HDL Ratio: 3.9 (calc) (ref <5.0)
Triglycerides: 79 mg/dL (ref <150)

## 2020-08-25 LAB — HEPATITIS C ANTIBODY
Hepatitis C Ab: NONREACTIVE
SIGNAL TO CUT-OFF: 0.03 (ref <1.00)

## 2020-08-25 LAB — EXTRA LAV TOP TUBE

## 2021-08-25 ENCOUNTER — Encounter: Payer: BC Managed Care – PPO | Admitting: Family Medicine

## 2021-09-01 ENCOUNTER — Other Ambulatory Visit: Payer: Self-pay

## 2021-09-01 ENCOUNTER — Encounter: Payer: Self-pay | Admitting: Family Medicine

## 2021-09-01 ENCOUNTER — Ambulatory Visit (INDEPENDENT_AMBULATORY_CARE_PROVIDER_SITE_OTHER): Payer: 59 | Admitting: Family Medicine

## 2021-09-01 VITALS — BP 108/70 | HR 68 | Temp 98.4°F | Ht 73.5 in | Wt 191.2 lb

## 2021-09-01 DIAGNOSIS — Z23 Encounter for immunization: Secondary | ICD-10-CM | POA: Diagnosis not present

## 2021-09-01 DIAGNOSIS — Z0000 Encounter for general adult medical examination without abnormal findings: Secondary | ICD-10-CM | POA: Diagnosis not present

## 2021-09-01 LAB — LIPID PANEL
Cholesterol: 213 mg/dL — ABNORMAL HIGH (ref 0–200)
HDL: 49.1 mg/dL (ref >39.00)
LDL Cholesterol: 142 mg/dL — ABNORMAL HIGH (ref 0–99)
NonHDL: 164.37
Total CHOL/HDL Ratio: 4
Triglycerides: 111 mg/dL (ref 0.0–149.0)
VLDL: 22.2 mg/dL (ref 0.0–40.0)

## 2021-09-01 LAB — CBC
HCT: 44.5 % (ref 39.0–52.0)
Hemoglobin: 14.8 g/dL (ref 13.0–17.0)
MCHC: 33.3 g/dL (ref 30.0–36.0)
MCV: 87.6 fl (ref 78.0–100.0)
Platelets: 235 10*3/uL (ref 150.0–400.0)
RBC: 5.08 Mil/uL (ref 4.22–5.81)
RDW: 12.7 % (ref 11.5–15.5)
WBC: 5 10*3/uL (ref 4.0–10.5)

## 2021-09-01 LAB — COMPREHENSIVE METABOLIC PANEL
ALT: 12 U/L (ref 0–53)
AST: 14 U/L (ref 0–37)
Albumin: 5 g/dL (ref 3.5–5.2)
Alkaline Phosphatase: 44 U/L (ref 39–117)
BUN: 14 mg/dL (ref 6–23)
CO2: 31 mEq/L (ref 19–32)
Calcium: 9.5 mg/dL (ref 8.4–10.5)
Chloride: 102 mEq/L (ref 96–112)
Creatinine, Ser: 0.87 mg/dL (ref 0.40–1.50)
GFR: 117.14 mL/min (ref >60.00)
Glucose, Bld: 90 mg/dL (ref 70–99)
Potassium: 4.4 mEq/L (ref 3.5–5.1)
Sodium: 140 mEq/L (ref 135–145)
Total Bilirubin: 0.8 mg/dL (ref 0.2–1.2)
Total Protein: 6.9 g/dL (ref 6.0–8.3)

## 2021-09-01 NOTE — Patient Instructions (Addendum)
Keep the diet clean and stay active.  Give [place] 2-3 business days to get the results of your labs back.   Do monthly self testicular checks in the shower. You are feeling for lumps/bumps that don't belong. If you feel anything like this, let me know!  Consider a wrist cock up splint for the hand/finger issue. You may be better enough where you don't need this at this time.   Let us know if you need anything.  Wrist and Forearm Exercises Do exercises exactly as told by your health care provider and adjust them as directed. It is normal to feel mild stretching, pulling, tightness, or discomfort as you do these exercises, but you should stop right away if you feel sudden pain or your pain gets worse.   RANGE OF MOTION EXERCISES These exercises warm up your muscles and joints and improve the movement and flexibility of your injured wrist and forearm. These exercises also help to relieve pain, numbness, and tingling. These exercises are done using the muscles in your injured wrist and forearm. Exercise A: Wrist Flexion, Active With your fingers relaxed, bend your wrist forward as far as you can. Hold this position for 30 seconds. Repeat 2 times. Complete this exercise 3 times per week. Exercise B: Wrist Extension, Active With your fingers relaxed, bend your wrist backward as far as you can. Hold this position for 30 seconds. Repeat 2 times. Complete this exercise 3 times per week. Exercise C: Supination, Active  Stand or sit with your arms at your sides. Bend your left / right elbow to an "L" shape (90 degrees). Turn your palm upward until you feel a gentle stretch on the inside of your forearm. Hold this position for 30 seconds. Slowly return your palm to the starting position. Repeat 2 times. Complete this exercise 3 times per week. Exercise D: Pronation, Active  Stand or sit with your arms at your sides. Bend your left / right elbow to an "L" shape (90 degrees). Turn your palm downward  until you feel a gentle stretch on the top of your forearm. Hold this position for 30 seconds. Slowly return your palm to the starting position. Repeat 2 times. Complete this exercise once a day.  STRETCHING EXERCISES These exercises warm up your muscles and joints and improve the movement and flexibility of your injured wrist and forearm. These exercises also help to relieve pain, numbness, and tingling. These exercises are done using your healthy wrist and forearm to help stretch the muscles in your injured wrist and forearm. Exercise E: Wrist Flexion, Passive  Extend your left / right arm in front of you, relax your wrist, and point your fingers downward. Gently push on the back of your hand. Stop when you feel a gentle stretch on the top of your forearm. Hold this position for 30 seconds. Repeat 2 times. Complete this exercise 3 times per week. Exercise F: Wrist Extension, Passive  Extend your left / right arm in front of you and turn your palm upward. Gently pull your palm and fingertips back so your fingers point downward. You should feel a gentle stretch on the palm-side of your forearm. Hold this position for 30 seconds. Repeat 2 times. Complete this exercise 3 times per week. Exercise G: Forearm Rotation, Supination, Passive Sit with your left / right elbow bent to an "L" shape (90 degrees) with your forearm resting on a table. Keeping your upper body and shoulder still, use your other hand to rotate your forearm  palm-up until you feel a gentle to moderate stretch. Hold this position for 30 seconds. Slowly release the stretch and return to the starting position. Repeat 2 times. Complete this exercise 3 times per week. Exercise H: Forearm Rotation, Pronation, Passive Sit with your left / right elbow bent to an "L" shape (90 degrees) with your forearm resting on a table. Keeping your upper body and shoulder still, use your other hand to rotate your forearm palm-down until you feel a  gentle to moderate stretch. Hold this position for 30 seconds. Slowly release the stretch and return to the starting position. Repeat 2 times. Complete this exercise 3 times per week.  STRENGTHENING EXERCISES These exercises build strength and endurance in your wrist and forearm. Endurance is the ability to use your muscles for a long time, even after they get tired. Exercise I: Wrist Flexors  Sit with your left / right forearm supported on a table and your hand resting palm-up over the edge of the table. Your elbow should be bent to an "L" shape (about 90 degrees) and be below the level of your shoulder. Hold a 3-5 lb weight in your left / right hand. Or, hold a rubber exercise band or tube in both hands, keeping your hands at the same level and hip distance apart. There should be a slight tension in the exercise band or tube. Slowly curl your hand up toward your forearm. Hold this position for 3 seconds. Slowly lower your hand back to the starting position. Repeat 2 times. Complete this exercise 3 times per week. Exercise J: Wrist Extensors  Sit with your left / right forearm supported on a table and your hand resting palm-down over the edge of the table. Your elbow should be bent to an "L" shape (about 90 degrees) and be below the level of your shoulder. Hold a 3-5 lb weight in your left / right hand. Or, hold a rubber exercise band or tube in both hands, keeping your hands at the same level and hip distance apart. There should be a slight tension in the exercise band or tube. Slowly curl your hand up toward your forearm. Hold this position for 3 seconds. Slowly lower your hand back to the starting position. Repeat 2 times. Complete this exercise 3 times per week. Exercise K: Forearm Rotation, Supination  Sit with your left / right forearm supported on a table and your hand resting palm-down. Your elbow should be at your side, bent to an "L" shape (about 90 degrees), and below the level  of your shoulder. Keep your wrist stable and in a neutral position throughout the exercise. Gently hold a lightweight hammer with your left / right hand. Without moving your elbow or wrist, slowly rotate your palm upward to a thumbs-up position. Hold this position for 3 seconds. Slowly return your forearm to the starting position. Repeat 2 times. Complete this exercise 3 times per week. Exercise L: Forearm Rotation, Pronation  Sit with your left / right forearm supported on a table and your hand resting palm-up. Your elbow should be at your side, bent to an "L" shape (about 90 degrees), and below the level of your shoulder. Keep your wrist stable. Do not allow it to move backward or forward during the exercise. Gently hold a lightweight hammer with your left / right hand. Without moving your elbow or wrist, slowly rotate your palm and hand upward to a thumbs-up position. Hold this position for 3 seconds. Slowly return your forearm to  the starting position. Repeat 2 times. Complete this exercise 3 times per week. Exercise M: Grip Strengthening  Hold one of these items in your left / right hand: play dough, therapy putty, a dense sponge, a stress ball, or a large, rolled sock. Squeeze as hard as you can without increasing pain. Hold this position for 5 seconds. Slowly release your grip. Repeat 2 times. Complete this exercise 3 times per week.  This information is not intended to replace advice given to you by your health care provider. Make sure you discuss any questions you have with your health care provider. Document Released: 09/19/2005 Document Revised: 07/30/2016 Document Reviewed: 07/31/2015 Elsevier Interactive Patient Education  [employer].

## 2021-09-01 NOTE — Addendum Note (Signed)
Addended by: Scharlene Gloss B on: 09/01/2021 07:32 AM   Modules accepted: Orders

## 2021-09-01 NOTE — Progress Notes (Signed)
Chief Complaint  Patient presents with   Annual Exam    Well Male Noah Fitzgerald is here for a complete physical.   His last physical was >1 year ago.  Current diet: in general, a "pretty healthy" diet.   Current exercise: in between gyms Weight trend: stable Fatigue out of ordinary? No. Seat belt? Yes.    Health maintenance Tetanus- Due HIV- Yes Hep C- Yes  Past Medical History:  Diagnosis Date   No known health problems     Past Surgical History:  Procedure Laterality Date   NO PAST SURGERIES     Medications  Takes no meds routinely.   Allergies No Known Allergies  Family History Family History  Problem Relation Age of Onset   Hyperlipidemia Father    Hypertension Father    Breast cancer Maternal Grandmother    Colon cancer Maternal Grandfather 60    Review of Systems: Constitutional: no fevers or chills Eye:  no recent significant change in vision Ear/Nose/Mouth/Throat:  Ears:  no hearing loss Nose/Mouth/Throat:  no complaints of nasal congestion, no sore throat Cardiovascular:  no chest pain Respiratory:  no shortness of breath Gastrointestinal:  no abdominal pain, no change in bowel habits GU:  Male: negative for dysuria Musculoskeletal/Extremities:  +intermittent R hand/index finger pain Integumentary (Skin/Breast):  no abnormal skin lesions reported Neurologic:  no headaches Endocrine: No unexpected weight changes Hematologic/Lymphatic:  no night sweats  Exam BP 108/70   Pulse 68   Temp 98.4 F (36.9 C) (Oral)   Ht 6' 1.5" (1.867 m)   Wt 191 lb 4 oz (86.8 kg)   SpO2 98%   BMI 24.89 kg/m  General:  well developed, well nourished, in no apparent distress Skin:  no significant moles, warts, or growths Head:  no masses, lesions, or tenderness Eyes:  pupils equal and round, sclera anicteric without injection Ears:  canals without lesions, TMs shiny without retraction, no obvious effusion, no erythema Nose:  nares patent, septum midline,  mucosa normal Throat/Pharynx:  lips and gingiva without lesion; tongue and uvula midline; non-inflamed pharynx; no exudates or postnasal drainage Neck: neck supple without adenopathy, thyromegaly, or masses Lungs:  clear to auscultation, breath sounds equal bilaterally, no respiratory distress Cardio:  regular rate and rhythm, no bruits, no LE edema Abdomen:  abdomen soft, nontender; bowel sounds normal; no masses or organomegaly Genital (male): Deferred Rectal: Deferred Musculoskeletal: mild ttp over the extensor forearm group on R of prox 1/3, milt ttp over extensor tendon of 2nd digit over dorsum of wrist on R; no pain with resisted wrist flexion/extension. No ttp over lateral epicondyle.  symmetrical muscle groups noted without atrophy or deformity Extremities:  no clubbing, cyanosis, or edema, no deformities, no skin discoloration Neuro:  gait normal; deep tendon reflexes normal and symmetric Psych: well oriented with normal range of affect and appropriate judgment/insight  Assessment and Plan  Well adult exam - Plan: CBC, Comprehensive metabolic panel, Lipid panel   Well 30 y.o. male. Counseled on diet and exercise. Self testicular exams recommended at least monthly.  I think wrist issue is extensor tendinopathy. Rec'd wrist brace and stretches/exercises given.  Other orders as above. Follow up in 1 year pending the above workup. The patient voiced understanding and agreement to the plan.  Jilda Roche [place], DO 09/01/21 7:22 AM

## 2022-09-03 ENCOUNTER — Ambulatory Visit (INDEPENDENT_AMBULATORY_CARE_PROVIDER_SITE_OTHER): Payer: 59 | Admitting: Family Medicine

## 2022-09-03 ENCOUNTER — Encounter: Payer: Self-pay | Admitting: Family Medicine

## 2022-09-03 VITALS — BP 102/60 | HR 69 | Temp 98.0°F | Ht 73.5 in | Wt 172.5 lb

## 2022-09-03 DIAGNOSIS — Z23 Encounter for immunization: Secondary | ICD-10-CM | POA: Diagnosis not present

## 2022-09-03 DIAGNOSIS — Z0189 Encounter for other specified special examinations: Secondary | ICD-10-CM | POA: Diagnosis not present

## 2022-09-03 DIAGNOSIS — Z0000 Encounter for general adult medical examination without abnormal findings: Secondary | ICD-10-CM | POA: Diagnosis not present

## 2022-09-03 LAB — COMPREHENSIVE METABOLIC PANEL
ALT: 12 U/L (ref 0–53)
AST: 14 U/L (ref 0–37)
Albumin: 4.9 g/dL (ref 3.5–5.2)
Alkaline Phosphatase: 54 U/L (ref 39–117)
BUN: 16 mg/dL (ref 6–23)
CO2: 29 mEq/L (ref 19–32)
Calcium: 9.1 mg/dL (ref 8.4–10.5)
Chloride: 101 mEq/L (ref 96–112)
Creatinine, Ser: 0.82 mg/dL (ref 0.40–1.50)
GFR: 118.42 mL/min (ref >60.00)
Glucose, Bld: 87 mg/dL (ref 70–99)
Potassium: 4.1 mEq/L (ref 3.5–5.1)
Sodium: 138 mEq/L (ref 135–145)
Total Bilirubin: 1.1 mg/dL (ref 0.2–1.2)
Total Protein: 6.9 g/dL (ref 6.0–8.3)

## 2022-09-03 LAB — CBC
HCT: 42.6 % (ref 39.0–52.0)
Hemoglobin: 14.4 g/dL (ref 13.0–17.0)
MCHC: 33.8 g/dL (ref 30.0–36.0)
MCV: 87 fl (ref 78.0–100.0)
Platelets: 210 10*3/uL (ref 150.0–400.0)
RBC: 4.9 Mil/uL (ref 4.22–5.81)
RDW: 12.5 % (ref 11.5–15.5)
WBC: 5.5 10*3/uL (ref 4.0–10.5)

## 2022-09-03 LAB — LIPID PANEL
Cholesterol: 170 mg/dL (ref 0–200)
HDL: 45 mg/dL (ref >39.00)
LDL Cholesterol: 107 mg/dL — ABNORMAL HIGH (ref 0–99)
NonHDL: 125.04
Total CHOL/HDL Ratio: 4
Triglycerides: 91 mg/dL (ref 0.0–149.0)
VLDL: 18.2 mg/dL (ref 0.0–40.0)

## 2022-09-03 NOTE — Patient Instructions (Addendum)
Give [place] 2-3 business days to get the results of your labs back.   Keep the diet clean and stay active.  Please get me a copy of your advanced directive form at your convenience.   Do monthly self testicular checks in the shower. You are feeling for lumps/bumps that don't belong. If you feel anything like this, let me know!  Combine Flonase and Zyrtec/Allegra etc when your allergies flare.   Let us know if you need anything.

## 2022-09-03 NOTE — Progress Notes (Signed)
Chief Complaint  Patient presents with   Annual Exam    Well Male Noah Fitzgerald is here for a complete physical.   His last physical was >1 year ago.  Current diet: in general, a "healthy" diet.   Current exercise: walking, lifting wts Weight trend: intentionally decreased Fatigue out of ordinary? No. Seat belt? Yes.   Advanced directive? No  Pt had a friend he went to HS with pass away from a PE. Wife wants him to have a blood test to ensure he does not have a clot which he is requesting today. No chest pain, SOB, calf pain, swelling, or personal/fam hx of blood clots.   Health maintenance Tetanus- Yes HIV- Yes Hep C- Yes  Past Medical History:  Diagnosis Date   No known health problems      Past Surgical History:  Procedure Laterality Date   NO PAST SURGERIES      Medications  Takes no meds routinely.   Allergies No Known Allergies  Family History Family History  Problem Relation Age of Onset   Hyperlipidemia Father    Hypertension Father    Breast cancer Maternal Grandmother    Colon cancer Maternal Grandfather 60    Review of Systems: Constitutional: no fevers or chills Eye:  no recent significant change in vision Ear/Nose/Mouth/Throat:  Ears:  no hearing loss Nose/Mouth/Throat:  no complaints of nasal congestion, no sore throat Cardiovascular:  no chest pain Respiratory:  no shortness of breath Gastrointestinal:  no abdominal pain, no change in bowel habits GU:  Male: negative for dysuria Musculoskeletal/Extremities:  no pain of the joints Integumentary (Skin/Breast):  no abnormal skin lesions reported Neurologic:  no headaches Endocrine: No unexpected weight changes Hematologic/Lymphatic:  no night sweats  Exam BP 102/60 (BP Location: Left Arm, Patient Position: Sitting, Cuff Size: Normal)   Pulse 69   Temp 98 F (36.7 C) (Oral)   Ht 6' 1.5" (1.867 m)   Wt 172 lb 8 oz (78.2 kg)   SpO2 99%   BMI 22.45 kg/m  General:  well developed, well  nourished, in no apparent distress Skin:  no significant moles, warts, or growths Head:  no masses, lesions, or tenderness Eyes:  pupils equal and round, sclera anicteric without injection Ears:  canals without lesions, TMs shiny without retraction, no obvious effusion, no erythema Nose:  nares patent, mucosa normal Throat/Pharynx:  lips and gingiva without lesion; tongue and uvula midline; non-inflamed pharynx; no exudates or postnasal drainage Neck: neck supple without adenopathy, thyromegaly, or masses Lungs:  clear to auscultation, breath sounds equal bilaterally, no respiratory distress Cardio:  regular rate and rhythm, no bruits, no LE edema Abdomen:  abdomen soft, nontender; bowel sounds normal; no masses or organomegaly Genital (male): Deferred Rectal: Deferred Musculoskeletal:  symmetrical muscle groups noted without atrophy or deformity Extremities:  no clubbing, cyanosis, or edema, no deformities, no skin discoloration Neuro:  gait normal; deep tendon reflexes normal and symmetric Psych: well oriented with normal range of affect and appropriate judgment/insight  Assessment and Plan  Well adult exam - Plan: CBC, Comprehensive metabolic panel, Lipid panel  Patient request for diagnostic testing - Plan: D-Dimer, Quantitative  Need for influenza vaccination - Plan: Flu Vaccine QUAD 6+ mos PF IM (Fluarix Quad PF)   Well 31 y.o. male. Counseled on diet and exercise. Self testicular exams recommended at least monthly.  Advanced directive form provided today.  Discussed risks/benefits of D dimer testing. After discussion, would still like to proceed. I did warn  him of false positives causing unnecessary need to obtain CTA chest imaging. 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 Flu shot today.  Other orders as above. Follow up in 1 year pending the above workup. The patient voiced understanding and agreement to the plan.  [place], DO 09/03/22 7:22  AM

## 2022-09-04 LAB — D-DIMER, QUANTITATIVE: D-Dimer, Quant: 0.22 mcg/mL FEU (ref <0.50)

## 2023-04-21 ENCOUNTER — Ambulatory Visit
Admission: EM | Admit: 2023-04-21 | Discharge: 2023-04-21 | Disposition: A | Discharge status: Home / Self Care | Payer: 59 | Attending: Nurse Practitioner | Admitting: Nurse Practitioner

## 2023-04-21 DIAGNOSIS — B9689 Other specified bacterial agents as the cause of diseases classified elsewhere: Secondary | ICD-10-CM | POA: Diagnosis not present

## 2023-04-21 DIAGNOSIS — J329 Chronic sinusitis, unspecified: Secondary | ICD-10-CM

## 2023-04-21 DIAGNOSIS — J209 Acute bronchitis, unspecified: Secondary | ICD-10-CM

## 2023-04-21 MED ORDER — PROMETHAZINE-DM 6.25-15 MG/5ML PO SYRP: 5.0000 mL | ORAL_SOLUTION | Freq: Four times a day (QID) | ORAL | 0 refills | Status: DC | PRN

## 2023-04-21 MED ORDER — AMOXICILLIN-POT CLAVULANATE 875-125 MG PO TABS: 1.0000 | ORAL_TABLET | Freq: Two times a day (BID) | ORAL | 0 refills | Status: DC

## 2023-04-21 NOTE — ED Triage Notes (Signed)
Pt presents to UC w/ c/o cough, nasal congestion x6 days. Fever last night.

## 2023-04-21 NOTE — ED Provider Notes (Signed)
[facility]    CSN: [id number] Arrival date & time: 04/21/23  0917      History   Chief Complaint Chief Complaint  Patient presents with   Cough    HPI Noah Fitzgerald is a 32 y.o. male  presents for evaluation of URI symptoms for 6-7 days. Patient reports associated symptoms of cough, sinus pressure/pain with nasal congestion and drainage.  He reports last night he had a fever of 101-102.  Denies N/V/D, sore throat, ear pain, body aches, shortness of breath. Patient does not have a hx of asthma or smoking.  Wife has similar symptoms.  Pt has taken allergy medicine, Flonase, Delsym OTC for symptoms. Pt has no other concerns at this time.    Cough Associated symptoms: fever     Past Medical History:  Diagnosis Date   No known health problems     There are no problems to display for this patient.   Past Surgical History:  Procedure Laterality Date   NO PAST SURGERIES         Home Medications    Prior to Admission medications   Medication Sig Start Date End Date Taking? Authorizing Provider  amoxicillin-clavulanate (AUGMENTIN) 875-125 MG tablet Take 1 tablet by mouth every 12 (twelve) hours for 10 days. 04/21/23 05/01/23 Yes Radford Pax, NP  promethazine-dextromethorphan (PROMETHAZINE-DM) 6.25-15 MG/5ML syrup Take 5 mLs by mouth 4 (four) times daily as needed for cough. 04/21/23  Yes Radford Pax, NP    Family History Family History  Problem Relation Age of Onset   Hyperlipidemia Father    Hypertension Father    Breast cancer Maternal Grandmother    Colon cancer Maternal Grandfather 39    Social History Social History   Tobacco Use   Smoking status: Never   Smokeless tobacco: Never  Substance Use Topics   Alcohol use: Yes   Drug use: Never     Allergies   Patient has no known allergies.   Review of Systems Review of Systems  Constitutional:  Positive for fever.  HENT:  Positive for congestion, sinus pressure and sinus pain.    Respiratory:  Positive for cough.      Physical Exam Triage Vital Signs ED Triage Vitals  Enc Vitals Group     BP 04/21/23 0935 116/75     Pulse Rate 04/21/23 0935 91     Resp 04/21/23 0935 16     Temp 04/21/23 0935 99.6 F (37.6 C)     Temp Source 04/21/23 0935 Oral     SpO2 04/21/23 0935 98 %     Weight --      Height --      Head Circumference --      Peak Flow --      Pain Score 04/21/23 0938 1     Pain Loc --      Pain Edu? --      Excl. in GC? --    No data found.  Updated Vital Signs BP 116/75 (BP Location: Right Arm)   Pulse 91   Temp 99.6 F (37.6 C) (Oral)   Resp 16   SpO2 98%   Visual Acuity Right Eye Distance:   Left Eye Distance:   Bilateral Distance:    Right Eye Near:   Left Eye Near:    Bilateral Near:     Physical Exam Vitals and nursing note reviewed.  Constitutional:      General: He is not in acute distress.  Appearance: Normal appearance. He is not ill-appearing or toxic-appearing.  HENT:     Head: Normocephalic and atraumatic.     Right Ear: Tympanic membrane and ear canal normal.     Left Ear: Tympanic membrane and ear canal normal.     Nose: Congestion present.     Right Turbinates: Swollen and pale.     Left Turbinates: Swollen and pale.     Right Sinus: Maxillary sinus tenderness present.     Left Sinus: Maxillary sinus tenderness present.     Mouth/Throat:     Mouth: Mucous membranes are moist.     Pharynx: No posterior oropharyngeal erythema.  Eyes:     Pupils: Pupils are equal, round, and reactive to light.  Cardiovascular:     Rate and Rhythm: Normal rate and regular rhythm.     Heart sounds: Normal heart sounds.  Pulmonary:     Effort: Pulmonary effort is normal.     Breath sounds: Normal breath sounds.  Musculoskeletal:     Cervical back: Normal range of motion and neck supple.  Lymphadenopathy:     Cervical: No cervical adenopathy.  Skin:    General: Skin is warm and dry.  Neurological:     General: No  focal deficit present.     Mental Status: He is alert and oriented to person, place, and time.  Psychiatric:        Mood and Affect: Mood normal.        Behavior: Behavior normal.      UC Treatments / Results  Labs (all labs ordered are listed, but only abnormal results are displayed) Labs Reviewed - No data to display  EKG   Radiology No results found.  Procedures Procedures (including critical care time)  Medications Ordered in UC Medications - No data to display  Initial Impression / Assessment and Plan / UC Course  I have reviewed the triage vital signs and the nursing notes.  Pertinent labs & imaging results that were available during my care of the patient were reviewed by me and considered in my medical decision making (see chart for details).     Start Augmentin twice daily for 10 days given length of symptoms and new onset fever Promethazine DM as needed for cough.  Side effect profile reviewed Continue Flonase and nasal rinses as tolerated Rest and fluids PCP follow-up if symptoms do not improve ER precautions reviewed and patient verbalized understanding Final Clinical Impressions(s) / UC Diagnoses   Final diagnoses:  Bacterial sinusitis  Acute bronchitis, unspecified organism     Discharge Instructions      Start Augmentin twice daily for 10 days Promethazine DM as needed for cough Continue Flonase and nasal rinses Rest and fluids Follow-up with PCP if symptoms do not improve Please go to the ER for any worsening symptoms I hope you feel better soon!   ED Prescriptions     Medication Sig Dispense Auth. Provider   amoxicillin-clavulanate (AUGMENTIN) 875-125 MG tablet Take 1 tablet by mouth every 12 (twelve) hours for 10 days. 20 tablet Radford Pax, NP   promethazine-dextromethorphan (PROMETHAZINE-DM) 6.25-15 MG/5ML syrup Take 5 mLs by mouth 4 (four) times daily as needed for cough. 118 mL Radford Pax, NP      PDMP not reviewed this  encounter.   Radford Pax, NP 04/21/23 1009

## 2023-04-21 NOTE — Discharge Instructions (Signed)
Start Augmentin twice daily for 10 days Promethazine DM as needed for cough Continue Flonase and nasal rinses Rest and fluids Follow-up with PCP if symptoms do not improve Please go to the ER for any worsening symptoms I hope you feel better soon!

## 2023-09-06 ENCOUNTER — Ambulatory Visit (INDEPENDENT_AMBULATORY_CARE_PROVIDER_SITE_OTHER): Payer: 59 | Admitting: Family Medicine

## 2023-09-06 ENCOUNTER — Other Ambulatory Visit: Payer: Self-pay | Admitting: Family Medicine

## 2023-09-06 ENCOUNTER — Encounter: Payer: Self-pay | Admitting: Family Medicine

## 2023-09-06 VITALS — BP 122/82 | HR 73 | Temp 98.3°F | Ht 74.0 in | Wt 186.1 lb

## 2023-09-06 DIAGNOSIS — Z0000 Encounter for general adult medical examination without abnormal findings: Secondary | ICD-10-CM | POA: Diagnosis not present

## 2023-09-06 DIAGNOSIS — E785 Hyperlipidemia, unspecified: Secondary | ICD-10-CM

## 2023-09-06 DIAGNOSIS — Z23 Encounter for immunization: Secondary | ICD-10-CM | POA: Diagnosis not present

## 2023-09-06 LAB — CBC
HCT: 43.9 % (ref 39.0–52.0)
Hemoglobin: 14.5 g/dL (ref 13.0–17.0)
MCHC: 33 g/dL (ref 30.0–36.0)
MCV: 88 fL (ref 78.0–100.0)
Platelets: 222 10*3/uL (ref 150.0–400.0)
RBC: 4.99 Mil/uL (ref 4.22–5.81)
RDW: 12.7 % (ref 11.5–15.5)
WBC: 4.9 10*3/uL (ref 4.0–10.5)

## 2023-09-06 LAB — COMPREHENSIVE METABOLIC PANEL
ALT: 14 U/L (ref 0–53)
AST: 15 U/L (ref 0–37)
Albumin: 4.8 g/dL (ref 3.5–5.2)
Alkaline Phosphatase: 44 U/L (ref 39–117)
BUN: 16 mg/dL (ref 6–23)
CO2: 31 meq/L (ref 19–32)
Calcium: 9.4 mg/dL (ref 8.4–10.5)
Chloride: 101 meq/L (ref 96–112)
Creatinine, Ser: 0.84 mg/dL (ref 0.40–1.50)
GFR: 116.73 mL/min (ref >60.00)
Glucose, Bld: 84 mg/dL (ref 70–99)
Potassium: 4.1 meq/L (ref 3.5–5.1)
Sodium: 139 meq/L (ref 135–145)
Total Bilirubin: 1 mg/dL (ref 0.2–1.2)
Total Protein: 6.8 g/dL (ref 6.0–8.3)

## 2023-09-06 LAB — LIPID PANEL
Cholesterol: 237 mg/dL — ABNORMAL HIGH (ref 0–200)
HDL: 57.5 mg/dL (ref >39.00)
LDL Cholesterol: 162 mg/dL — ABNORMAL HIGH (ref 0–99)
NonHDL: 179.82
Total CHOL/HDL Ratio: 4
Triglycerides: 87 mg/dL (ref 0.0–149.0)
VLDL: 17.4 mg/dL (ref 0.0–40.0)

## 2023-09-06 NOTE — Progress Notes (Signed)
Chief Complaint  Patient presents with   Annual Exam    Well Male Noah Fitzgerald is here for a complete physical.   His last physical was >1 year ago.  Current diet: in general, diet is OK.   Current exercise: some walking Weight trend: increased Fatigue out of ordinary? No. Seat belt? Yes.   Advanced directive? No  Health maintenance Tetanus- Yes HIV- Yes Hep C- Yes  Past Medical History:  Diagnosis Date   No known health problems      Past Surgical History:  Procedure Laterality Date   NO PAST SURGERIES      Medications  Takes no meds routinely.     Allergies No Known Allergies  Family History Family History  Problem Relation Age of Onset   Hyperlipidemia Father    Hypertension Father    Breast cancer Maternal Grandmother    Colon cancer Maternal Grandfather 60    Review of Systems: Constitutional: no fevers or chills Eye:  no recent significant change in vision Ear/Nose/Mouth/Throat:  Ears:  no hearing loss Nose/Mouth/Throat:  no complaints of nasal congestion, no sore throat Cardiovascular:  no chest pain Respiratory:  no shortness of breath Gastrointestinal:  no abdominal pain, no change in bowel habits GU:  Male: negative for dysuria Musculoskeletal/Extremities:  no pain of the joints Integumentary (Skin/Breast):  no abnormal skin lesions reported Neurologic:  no headaches Endocrine: No unexpected weight changes Hematologic/Lymphatic:  no night sweats  Exam BP 122/82 (BP Location: Left Arm, Patient Position: Sitting, Cuff Size: Normal)   Pulse 73   Temp 98.3 F (36.8 C) (Oral)   Ht 6\' 2"  (1.88 m)   Wt 186 lb 2 oz (84.4 kg)   SpO2 99%   BMI 23.90 kg/m  General:  well developed, well nourished, in no apparent distress Skin:  no significant moles, warts, or growths Head:  no masses, lesions, or tenderness Eyes:  pupils equal and round, sclera anicteric without injection Ears:  canals without lesions, TMs shiny without retraction, no  obvious effusion, no erythema Nose:  nares patent, mucosa normal Throat/Pharynx:  lips and gingiva without lesion; tongue and uvula midline; non-inflamed pharynx; no exudates or postnasal drainage Neck: neck supple without adenopathy, thyromegaly, or masses Lungs:  clear to auscultation, breath sounds equal bilaterally, no respiratory distress Cardio:  regular rate and rhythm, no bruits, no LE edema Abdomen:  abdomen soft, nontender; bowel sounds normal; no masses or organomegaly Genital (male): Deferred Rectal: Deferred Musculoskeletal:  symmetrical muscle groups noted without atrophy or deformity Extremities:  no clubbing, cyanosis, or edema, no deformities, no skin discoloration Neuro:  gait normal; deep tendon reflexes normal and symmetric Psych: well oriented with normal range of affect and appropriate judgment/insight  Assessment and Plan  Well adult exam - Plan: CBC, Comprehensive metabolic panel, Lipid panel   Well 32 y.o. male. Counseled on diet and exercise. Self testicular exams recommended at least monthly.  Flu shot today.  Advanced directive form provided today.  Other orders as above. Follow up in 1 year pending the above workup. The patient voiced understanding and agreement to the plan.  Jilda Roche [place], DO 09/06/23 7:08 AM

## 2023-09-06 NOTE — Addendum Note (Signed)
Addended by: Scharlene Gloss B on: 09/06/2023 07:26 AM   Modules accepted: Orders

## 2023-09-06 NOTE — Patient Instructions (Signed)
Give [place] 2-3 business days to get the results of your labs back.   Keep the diet clean and stay active.  Do monthly self testicular checks in the shower. You are feeling for lumps/bumps that don't belong. If you feel anything like this, let me know!  Please get me a copy of your advanced directive form at your convenience.   Let us know if you need anything.

## 2023-10-21 ENCOUNTER — Other Ambulatory Visit: Payer: 59

## 2024-08-19 ENCOUNTER — Ambulatory Visit (INDEPENDENT_AMBULATORY_CARE_PROVIDER_SITE_OTHER): Admitting: Family Medicine

## 2024-08-19 ENCOUNTER — Encounter (HOSPITAL_BASED_OUTPATIENT_CLINIC_OR_DEPARTMENT_OTHER): Payer: Self-pay | Admitting: Family Medicine

## 2024-08-19 VITALS — BP 121/74 | HR 94 | Temp 97.4°F | Resp 16 | Ht 74.41 in | Wt 187.7 lb

## 2024-08-19 DIAGNOSIS — Z0000 Encounter for general adult medical examination without abnormal findings: Secondary | ICD-10-CM | POA: Diagnosis not present

## 2024-08-19 DIAGNOSIS — Z23 Encounter for immunization: Secondary | ICD-10-CM

## 2024-08-19 LAB — LIPID PANEL
Chol/HDL Ratio: 3.9 ratio (ref 0.0–5.0)
Cholesterol, Total: 207 mg/dL — ABNORMAL HIGH (ref 100–199)
HDL: 53 mg/dL (ref >39)
LDL Chol Calc (NIH): 141 mg/dL — ABNORMAL HIGH (ref 0–99)
Triglycerides: 71 mg/dL (ref 0–149)
VLDL Cholesterol Cal: 13 mg/dL (ref 5–40)

## 2024-08-19 LAB — CBC WITH DIFFERENTIAL/PLATELET
Basophils Absolute: 0 x10E3/uL (ref 0.0–0.2)
Basos: 1 %
EOS (ABSOLUTE): 0.1 x10E3/uL (ref 0.0–0.4)
Eos: 2 %
Hematocrit: 42.6 % (ref 37.5–51.0)
Hemoglobin: 14.4 g/dL (ref 13.0–17.7)
Immature Grans (Abs): 0 x10E3/uL (ref 0.0–0.1)
Immature Granulocytes: 0 %
Lymphocytes Absolute: 1.2 x10E3/uL (ref 0.7–3.1)
Lymphs: 20 %
MCH: 29.9 pg (ref 26.6–33.0)
MCHC: 33.8 g/dL (ref 31.5–35.7)
MCV: 89 fL (ref 79–97)
Monocytes Absolute: 0.5 x10E3/uL (ref 0.1–0.9)
Monocytes: 8 %
Neutrophils Absolute: 4.1 x10E3/uL (ref 1.4–7.0)
Neutrophils: 69 %
Platelets: 199 x10E3/uL (ref 150–450)
RBC: 4.81 x10E6/uL (ref 4.14–5.80)
RDW: 12.2 % (ref 11.6–15.4)
WBC: 6 x10E3/uL (ref 3.4–10.8)

## 2024-08-19 LAB — COMPREHENSIVE METABOLIC PANEL WITH GFR
ALT: 12 IU/L (ref 0–44)
AST: 16 IU/L (ref 0–40)
Albumin: 4.6 g/dL (ref 4.1–5.1)
Alkaline Phosphatase: 59 IU/L (ref 47–123)
BUN/Creatinine Ratio: 14 (ref 9–20)
BUN: 13 mg/dL (ref 6–20)
Bilirubin Total: 0.6 mg/dL (ref 0.0–1.2)
CO2: 24 mmol/L (ref 20–29)
Calcium: 9.1 mg/dL (ref 8.7–10.2)
Chloride: 101 mmol/L (ref 96–106)
Creatinine, Ser: 0.9 mg/dL (ref 0.76–1.27)
Globulin, Total: 1.8 g/dL (ref 1.5–4.5)
Glucose: 79 mg/dL (ref 70–99)
Potassium: 4.3 mmol/L (ref 3.5–5.2)
Sodium: 140 mmol/L (ref 134–144)
Total Protein: 6.4 g/dL (ref 6.0–8.5)
eGFR: 117 mL/min/1.73 (ref >59)

## 2024-08-19 NOTE — Progress Notes (Signed)
 Patient is in office today for a nurse visit for Flu injection. Patient Injection was given in the  Right deltoid. Patient tolerated injection well.

## 2024-08-19 NOTE — Assessment & Plan Note (Signed)
 Continue healthy habits.  Flu shot encouraged.  Also suggested he consider a Covid booster and a DTaP booster if due.  Await labs and f/u with him soon.

## 2024-08-19 NOTE — Progress Notes (Signed)
 Established Patient Office Visit  Subjective   Patient ID: Noah Fitzgerald, male    DOB: 13-Feb-1991  Age: 33 y.o. MRN: [id number]  Chief Complaint  Patient presents with   Establish Care    Establish Care     F/u as above.   Requests a Wellness exam.  Known to [facility] but new to my practice.  Other than a mild URI, he seems to feel well.  He does a fine job with his diet and exercise and is commended.    Past Medical History:  Diagnosis Date   Allergies     Outpatient Encounter Medications as of 08/19/2024  Medication Sig   fluticasone (FLONASE) 50 MCG/ACT nasal spray    No facility-administered encounter medications on file as of 08/19/2024.    Social History   Tobacco Use   Smoking status: Never   Smokeless tobacco: Never  Vaping Use   Vaping status: Never Used  Substance Use Topics   Alcohol use: Yes    Comment: Rare   Drug use: Never   Family History  Problem Relation Age of Onset   Hyperlipidemia Father    Hypertension Father    Breast cancer Maternal Grandmother    Colon cancer Maternal Grandfather 60       Review of Systems  Constitutional: Negative.   HENT: Negative.    Eyes: Negative.   Respiratory: Negative.    Cardiovascular: Negative.   Gastrointestinal: Negative.   Genitourinary: Negative.   Musculoskeletal: Negative.   Skin: Negative.   Neurological: Negative.   Endo/Heme/Allergies: Negative.   Psychiatric/Behavioral: Negative.        Objective:     BP 121/74 (Cuff Size: Normal)   Pulse 94   Temp (!) 97.4 F (36.3 C) (Oral)   Resp 16   Ht 6' 2.41 (1.89 m)   Wt 187 lb 11.2 oz (85.1 kg)   SpO2 96%   BMI 23.84 kg/m    Physical Exam Constitutional:      General: He is not in acute distress.    Appearance: Normal appearance.     Comments: Athletic appearing  HENT:     Head: Normocephalic.     Comments: Advised eye doctor and dentist    Right Ear: Tympanic membrane normal.     Left Ear: Tympanic membrane  normal.     Nose: Nose normal.     Mouth/Throat:     Pharynx: Oropharynx is clear.  Eyes:     Extraocular Movements: Extraocular movements intact.     Pupils: Pupils are equal, round, and reactive to light.  Neck:     Thyroid: No thyroid mass, thyromegaly or thyroid tenderness.     Vascular: No carotid bruit.  Cardiovascular:     Rate and Rhythm: Normal rate and regular rhythm.     Pulses: Normal pulses.     Heart sounds: Normal heart sounds.  Pulmonary:     Breath sounds: Normal breath sounds.  Abdominal:     General: Bowel sounds are normal. There is no distension.     Palpations: Abdomen is soft. There is no mass.     Tenderness: There is no abdominal tenderness.     Hernia: No hernia is present.  Genitourinary:    Penis: Normal.      Testes: Normal.  Musculoskeletal:        General: No tenderness. Normal range of motion.     Cervical back: Normal range of motion and neck supple. No tenderness.  Right lower leg: No edema.     Left lower leg: No edema.  Lymphadenopathy:     Cervical: No cervical adenopathy.  Skin:    General: Skin is warm and dry.     Findings: No lesion.  Neurological:     General: No focal deficit present.     Mental Status: He is alert.  Psychiatric:        Mood and Affect: Mood normal.      No results found for any visits on 08/19/24.    The ASCVD Risk score (Arnett DK, et al., 2019) failed to calculate for the following reasons:   The 2019 ASCVD risk score is only valid for ages 38 to 32    Assessment & Plan:  Wellness examination Assessment & Plan: Continue healthy habits.  Flu shot encouraged.  Also suggested he consider a Covid booster and a DTaP booster if due.  Await labs and f/u with him soon.  Orders: -     Comprehensive metabolic panel with GFR -     CBC with Differential/Platelet -     Lipid panel  Encounter for immunization -     Flu vaccine trivalent PF, 6mos and older(Flulaval,Afluria,Fluarix,Fluzone)    Return  if symptoms worsen or fail to improve.    REDDING PONCE NORLEEN FALCON., MD

## 2024-08-20 ENCOUNTER — Ambulatory Visit (HOSPITAL_BASED_OUTPATIENT_CLINIC_OR_DEPARTMENT_OTHER): Payer: Self-pay | Admitting: Family Medicine

## 2024-09-07 ENCOUNTER — Encounter: Payer: 59 | Admitting: Family Medicine
